# Patient Record
Sex: Male | Born: 1970 | Race: White | Hispanic: No | Marital: Married | Smoking: Never smoker
Health system: Southern US, Community
[De-identification: ages and names within clinical notes are randomized; demographics above are authoritative.]

---

## 2011-04-28 ENCOUNTER — Ambulatory Visit (INDEPENDENT_AMBULATORY_CARE_PROVIDER_SITE_OTHER): Payer: 59

## 2011-04-28 DIAGNOSIS — R5381 Other malaise: Secondary | ICD-10-CM

## 2011-04-28 DIAGNOSIS — J40 Bronchitis, not specified as acute or chronic: Secondary | ICD-10-CM

## 2011-04-28 DIAGNOSIS — R05 Cough: Secondary | ICD-10-CM

## 2011-04-28 DIAGNOSIS — J019 Acute sinusitis, unspecified: Secondary | ICD-10-CM

## 2017-10-12 ENCOUNTER — Encounter (HOSPITAL_COMMUNITY): Payer: Self-pay

## 2017-10-12 ENCOUNTER — Emergency Department (HOSPITAL_COMMUNITY)
Admission: EM | Admit: 2017-10-12 | Discharge: 2017-10-12 | Disposition: A | Discharge status: Home / Self Care | Payer: No Typology Code available for payment source | Attending: Physician Assistant | Admitting: Physician Assistant

## 2017-10-12 ENCOUNTER — Other Ambulatory Visit: Payer: Self-pay

## 2017-10-12 ENCOUNTER — Emergency Department (HOSPITAL_COMMUNITY): Payer: No Typology Code available for payment source

## 2017-10-12 DIAGNOSIS — Y939 Activity, unspecified: Secondary | ICD-10-CM | POA: Diagnosis not present

## 2017-10-12 DIAGNOSIS — S86111A Strain of other muscle(s) and tendon(s) of posterior muscle group at lower leg level, right leg, initial encounter: Secondary | ICD-10-CM | POA: Diagnosis not present

## 2017-10-12 DIAGNOSIS — X58XXXA Exposure to other specified factors, initial encounter: Secondary | ICD-10-CM | POA: Insufficient documentation

## 2017-10-12 DIAGNOSIS — Y990 Civilian activity done for income or pay: Secondary | ICD-10-CM | POA: Diagnosis not present

## 2017-10-12 DIAGNOSIS — Y929 Unspecified place or not applicable: Secondary | ICD-10-CM | POA: Diagnosis not present

## 2017-10-12 DIAGNOSIS — Z79899 Other long term (current) drug therapy: Secondary | ICD-10-CM | POA: Insufficient documentation

## 2017-10-12 MED ORDER — IBUPROFEN 400 MG PO TABS
600.0000 mg | ORAL_TABLET | Freq: Once | ORAL | Status: AC
  Administered 2017-10-12: 600 mg via ORAL
  Filled 2017-10-12: qty 1

## 2017-10-12 MED ORDER — METHOCARBAMOL 500 MG PO TABS
500.0000 mg | ORAL_TABLET | Freq: Once | ORAL | Status: AC
  Administered 2017-10-12: 500 mg via ORAL
  Filled 2017-10-12: qty 1

## 2017-10-12 MED ORDER — METHOCARBAMOL 500 MG PO TABS: 500.0000 mg | ORAL_TABLET | Freq: Two times a day (BID) | ORAL | 0 refills | Status: DC

## 2017-10-12 NOTE — ED Provider Notes (Signed)
Patient placed in Quick Look pathway, seen and evaluated   Chief Complaint: Right leg pain  HPI:   Patient was stepping up on the bumper of his car with his right leg when he felt a pop in his right calf and had acute onset pain.  Since then, he reports significant calf pain, which is constant severe.  He has not taken anything for pain including Tylenol or ibuprofen.  He denies numbness or tingling.  He denies hitting his leg.  He has no medical problems, does not take medications daily.  No recent antibiotics or steroids.  He does not have an orthopedic doctor.  No injury elsewhere.  ROS: Right leg pain  Physical Exam:   Gen: No distress  Neuro: Awake and Alert  Skin: Warm    Focused Exam: Tenderness palpation of the right gastroc muscle.  Mild tenderness to palpation of lower posterior right leg and posterior right upper leg.  Pedal pulses intact bilaterally.  Sensation intact bilaterally.  Achilles tendon intact.  Will give medications for pain, obtain dg R tib fib. Likely muscle strain.    Initiation of care has begun. The patient has been counseled on the process, plan, and necessity for staying for the completion/evaluation, and the remainder of the medical screening examination    Alveria Apley, PA-C 10/12/17 1515    Arby Barrette, MD 10/20/17 1526

## 2017-10-12 NOTE — Progress Notes (Signed)
Orthopedic Tech Progress Note Patient Details:  Shane Shaw [birth year] [id number]  Ortho Devices Type of Ortho Device: Ace wrap, Crutches Ortho Device/Splint Interventions: Ordered, Application, Adjustment   Post Interventions Patient Tolerated: Well Instructions Provided: Care of device   [name] 10/12/2017, 6:39 PM

## 2017-10-12 NOTE — ED Provider Notes (Signed)
[facility] EMERGENCY DEPARTMENT Provider Note   CSN: [id number] Arrival date & time: 10/12/17  1334     History   Chief Complaint Chief Complaint  Patient presents with  . Leg Injury    HPI Everard Interrante is a 47 y.o. male with no significant past medical history who presents to the emergency department from [facility] with a chief complaint of right lower leg injury.  The patient reports that he heard a "pop" followed by sudden onset, severe pain in the mid right calf earlier today while he was at work stepping up into a truck bed.  He has been unable to put weight or ambulate since the injury.  He denies numbness or weakness.  No right knee, ankle, or right foot pain.  He was seen at urgent care, but was advised to come to the emergency department for further evaluation for a possible Achilles tendon rupture.  No history of right lower extremity injury or surgery.  He is a non-smoker.  No history of diabetes.  The history is provided by the patient. No language interpreter was used.    History reviewed. No pertinent past medical history.  There are no active problems to display for this patient.   History reviewed. No pertinent surgical history.      Home Medications    Prior to Admission medications   Medication Sig Start Date End Date Taking? Authorizing Provider  cetirizine (ZYRTEC) 10 MG tablet Take 10 mg by mouth daily as needed for allergies.   Yes [provider]  loratadine (CLARITIN) 10 MG tablet Take 10 mg by mouth daily as needed for allergies.   Yes [provider]  methocarbamol (ROBAXIN) 500 MG tablet Take 1 tablet (500 mg total) by mouth 2 (two) times daily. 10/12/17   Brystal Kildow A, PA-C    Family History History reviewed. No pertinent family history.  Social History Social History   Tobacco Use  . Smoking status: Never Smoker  . Smokeless tobacco: Never Used  Substance Use Topics  . Alcohol use: Not Currently    . Drug use: Not on file     Allergies   Patient has no known allergies.   Review of Systems Review of Systems  Constitutional: Negative for activity change.  Respiratory: Negative for shortness of breath.   Cardiovascular: Negative for chest pain.  Gastrointestinal: Negative for abdominal pain.  Musculoskeletal: Positive for gait problem and myalgias. Negative for arthralgias, back pain and joint swelling.  Skin: Negative for color change, rash and wound.  Neurological: Negative for weakness and numbness.     Physical Exam Updated Vital Signs BP (!) 132/92 (BP Location: Left Arm)   Pulse 65   Temp 97.9 F (36.6 C) (Oral)   Resp 18   SpO2 97%   Physical Exam  Constitutional: He appears well-developed.  HENT:  Head: Normocephalic.  Eyes: Conjunctivae are normal.  Neck: Neck supple.  Cardiovascular: Normal rate and regular rhythm.  No murmur heard. Pulmonary/Chest: Effort normal.  Abdominal: Soft. He exhibits no distension.  Musculoskeletal: Normal range of motion. He exhibits tenderness. He exhibits no edema or deformity.  Tender to palpation over the right mid calf.  Negative Oestreicher's test.  Achilles tendon is palpable and feels intact with range of motion on exam.  Sensation is intact and symmetric throughout the bilateral lower extremities.  DP and PT pulses are 2+ and symmetric.  Full active and passive range of motion of the right knee and ankle.  5 out of 5 strength against resistance with plantarflexion.  Decreased strength against resistance with dorsiflexion secondary to pain.  No overlying ecchymosis, erythema, edema, or warmth to the right calf.  Neurological: He is alert.  Skin: Skin is warm and dry.  Psychiatric: His behavior is normal.  Nursing note and vitals reviewed.    ED Treatments / Results  Labs (all labs ordered are listed, but only abnormal results are displayed) Labs Reviewed - No data to display  EKG None  Radiology Dg Tibia/fibula  Right  Result Date: 10/12/2017 CLINICAL DATA:  Acute pain with possible tendon rupture. EXAM: RIGHT TIBIA AND FIBULA - 2 VIEW COMPARISON:  None. FINDINGS: No bone or joint abnormality. Achilles tendon shadow does not appear disrupted or thickened. IMPRESSION: Negative radiographs. Electronically Signed   By: Paulina Fusi M.D.   On: 10/12/2017 16:25    Procedures Procedures (including critical care time)  Medications Ordered in ED Medications  methocarbamol (ROBAXIN) tablet 500 mg (500 mg Oral Given 10/12/17 1547)  ibuprofen (ADVIL,MOTRIN) tablet 600 mg (600 mg Oral Given 10/12/17 1547)     Initial Impression / Assessment and Plan / ED Course  I have reviewed the triage vital signs and the nursing notes.  Pertinent labs & imaging results that were available during my care of the patient were reviewed by me and considered in my medical decision making (see chart for details).     47 year old male presenting with a right lower extremity injury.  On exam, the patient is tender to palpation in the posterior, mid right calf.  No bony tenderness.  No overlying ecchymosis or swelling concerning for hematoma.  Patient X-Ray negative for obvious fracture or dislocation.  The Achilles tendon shadow does not appear thickened or disrupted.  Pain managed in ED. doubt compartment syndrome, hematoma, or Achilles tendon rupture.  Pt advised to follow up with orthopedics if symptoms persist for possibility of missed fracture diagnosis. Patient given ace wrap and crutches while in ED, conservative therapy recommended and discussed. Patient will be dc home & is agreeable with above plan.  Final Clinical Impressions(s) / ED Diagnoses   Final diagnoses:  Strain of right gastrocnemius muscle, initial encounter    ED Discharge Orders        Ordered    methocarbamol (ROBAXIN) 500 MG tablet  2 times daily     10/12/17 1757       Jossiah Smoak, Coral Else, PA-C 10/12/17 1806    Mackuen, Cindee Salt, MD 10/13/17  0019

## 2017-10-12 NOTE — ED Notes (Signed)
See provider assessment 

## 2017-10-12 NOTE — ED Triage Notes (Signed)
Pt states he was attempting to step up a step at work and felt a pop in his right calf. Pt went to an urgent care and was sent over for further evaluation of possible achilles rupture. Pt states he is unable to completely straighten his leg.

## 2017-10-12 NOTE — Discharge Instructions (Addendum)
Thank you for allowing me to provide your care today in the emergency department.   Apply ice for 20 minutes up to 4 times daily until your swelling improves. Use an ace wrap or a compression sleeve over the calf for pain and swelling control. When you are sitting and resting, elevate your right leg on pillows above the level of your heart.   Take 600 mg of ibuprofen or 650 mg of Tylenol every 6 hours for pain control. You can also alternate between these two medications every 3 hours. Take one tablet of Robaxin up to 2 times daily for muscle pain or spasms.  Start putting weight on your right foot as your pain allows. Once you can walk without a significant limp, start taking walks three times each day for 10 minutes at a time. If you can perform a heel raise standing on both legs without unbearable pain, start performing this exercise regularly, starting with a single set of fiver to eight repetitions with the knees pent, and then repeating this with the knees straight.  Gradually, increase the number of sets and repetitions. Your ultimate goal should be able to perform three sets of 15 repetitions with the knees straight and then the same number with the knees bent.  If your pain does not start to improve with this regimen in the next week, please call and schedule follow-up appointment with Dr. August Saucer.  Return to the emergency department if you develop new numbness or weakness in the right lower leg or foot, if your toes turn blue, if you are unable to feel a pulse in the right foot, if you have a new fall or injury, or develop other new concerning symptoms.

## 2019-01-30 ENCOUNTER — Ambulatory Visit
Admission: RE | Admit: 2019-01-30 | Discharge: 2019-01-30 | Disposition: A | Discharge status: Home / Self Care | Payer: 59 | Source: Ambulatory Visit | Attending: Family Medicine | Admitting: Family Medicine

## 2019-01-30 ENCOUNTER — Other Ambulatory Visit: Payer: Self-pay

## 2019-01-30 ENCOUNTER — Ambulatory Visit (INDEPENDENT_AMBULATORY_CARE_PROVIDER_SITE_OTHER): Payer: 59 | Admitting: Family Medicine

## 2019-01-30 ENCOUNTER — Ambulatory Visit: Payer: Self-pay

## 2019-01-30 VITALS — BP 120/70 | Ht 70.0 in | Wt 215.0 lb

## 2019-01-30 DIAGNOSIS — M25511 Pain in right shoulder: Secondary | ICD-10-CM

## 2019-01-30 MED ORDER — DICLOFENAC SODIUM 75 MG PO TBEC: 75.0000 mg | DELAYED_RELEASE_TABLET | Freq: Two times a day (BID) | ORAL | 1 refills | Status: DC

## 2019-01-30 NOTE — Patient Instructions (Signed)
I'm concerned you have a tear of your supraspinatus (one of your rotator cuff muscles). We will go ahead with an MRI to further assess. Ice the area 15 minutes at a time 3-4 times a day. Diclofenac 75mg  twice a day with food for pain and inflammation - don't take aleve or ibuprofen while taking this. Ok to take tylenol though if you need something in addition to the diclofenac. Follow up with me for a no charge visit to go over the MRI results.

## 2019-01-30 NOTE — Progress Notes (Signed)
PCP: Patient, No Pcp Per  Subjective:   HPI: Patient is a 48 y.o. male here for right shoulder pain x 3 mo. States has PMHx of right shoulder limited ROM above head, has a "catch" in his shoulder and has to rotate it backward to get is arm over head.  Pt states was doing cross country race 3 mo ago and slipped while running and fell on right shoulder, pt has immediate pain but was able to finish race. States took IBU and iced shoulder for tx of pain, states pain gradually worsened over 3 months, was initially mild but now affects his sleep (side sleeper). 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 Pt states now he has hard time lifting arm overhead. Does endorsee arm weakness but due to pain, denies dropping tools or objects but has difficulty lifting heavier ones. Denies swelling, bruising or skin changes over shoulder. Does endorse some intermittent finger tingling at pinky and ring finger on right hand but denies LOF, numbness or loss of sensation.   No past medical history on file.  Current Outpatient Medications on File Prior to Visit  Medication Sig Dispense Refill  . cetirizine (ZYRTEC) 10 MG tablet Take 10 mg by mouth daily as needed for allergies.    Marland Kitchen. loratadine (CLARITIN) 10 MG tablet Take 10 mg by mouth daily as needed for allergies.    . methocarbamol (ROBAXIN) 500 MG tablet Take 1 tablet (500 mg total) by mouth 2 (two) times daily. 20 tablet 0   No current facility-administered medications on file prior to visit.     No past surgical history on file.  No Known Allergies  Social History   Socioeconomic History  . Marital status: Married    Spouse name: Not on file  . Number of children: Not on file  . Years of education: Not on file  . Highest education level: Not on file  Occupational History  . Not on file  Social Needs  . Financial resource strain: Not on file  . Food insecurity    Worry: Not on file    Inability: Not on file   . Transportation needs    Medical: Not on file    Non-medical: Not on file  Tobacco Use  . Smoking status: Never Smoker  . Smokeless tobacco: Never Used  Substance and Sexual Activity  . Alcohol use: Not Currently  . Drug use: Not on file  . Sexual activity: Not on file  Lifestyle  . Physical activity    Days per week: Not on file    Minutes per session: Not on file  . Stress: Not on file  Relationships  . Social Musicianconnections    Talks on phone: Not on file    Gets together: Not on file    Attends religious service: Not on file    Active member of club or organization: Not on file    Attends meetings of clubs or organizations: Not on file    Relationship status: Not on file  . Intimate partner violence    Fear of current or ex partner: Not on file    Emotionally abused: Not on file    Physically abused: Not on file    Forced sexual activity: Not on file  Other Topics Concern  . Not on file  Social History Narrative  . Not on file    No family history on file.  BP 120/70   Ht 5'  10" (1.778 m)   Wt 215 lb (97.5 kg)   BMI 30.85 kg/m   Review of Systems: See HPI above.     Objective:  Physical Exam:  Gen: NAD, comfortable in exam room Pulm: no increased work of breathing CA: cap refill<2sec Neck: atraumatic, cervical processes midline w/o TTP, Full AROM, neg Spurling  Shoulder R: symmetrical with left w/o swelling or ecchymosis. clavicles intact bilaterally.  No instability.  Minimal tenderness of AC joint. Limited ABDuction left shoulder to 90 degrees, limited shoulder extension to 90 degrees, pt unable to reach behind back 2/2 pain. 4/5 shoulder ABduction, ext rotation and extension when compared to R. Pos empty can and near test left side. Pn and limited shoulder external rotation to 45 degrees.   NVI distally.  Elbow R: no swelling, AFOM, no TTP over ulnar n. No subluxation palpated 5/5 ext/flexion and suponation and pronation, sensation intact   Wrist R. No  swelling, Full AROM, neg tinel's sign over wrist, 5/5 wrist ext/flex with hand interosseous muscles intact, sensation intact Ulnar and brachioradialis reflex intact L & right 1+   Shoulder L: No swelling, ecchymoses.  No gross deformity. No TTP. FROM. Strength 5/5 with empty can and resisted internal/external rotation. NV intact distally.  Complete R shoulder exam [place]:  Biceps tendon: hypoechoic region noted about 20% of area of tendon but without tear.  No tenosynovitis. Subscapularis: intact without abnormalities AC joint: small effusion, mild arthropathy.  No other abnormalities - no indication grade 3+ separation Infraspinatus: intact without abnormalities Supraspinatus: Unable to position in modified crass position.  Visualized supraspinatus with hypoechoic signal on bursal and insertional sides with some fibers visualized out to insertion.  Glenohumeral joint: no obvious abnormalities.  Visualized labrum appears normal.   Impression: Proximal biceps tendinopathy.  Concern for high grade partial supraspinatus tear.  Assessment & Plan:  1. Right shoulder injury - concern for high grade partial supraspinatus tear given weakness on testing, decreased motion, and ultrasound findings.  Radiographs negative - will go ahead with MRI to further assess, consider ortho referral for repair.  Icing, diclofenac, tylenol in meantime.  F/u to go over MRI.

## 2019-01-31 ENCOUNTER — Encounter: Payer: Self-pay | Admitting: Family Medicine

## 2019-03-21 ENCOUNTER — Other Ambulatory Visit: Payer: Self-pay | Admitting: Family Medicine

## 2019-03-26 ENCOUNTER — Telehealth: Payer: Self-pay

## 2019-03-26 NOTE — Telephone Encounter (Signed)
Pt states he doesn't need a refill, the pharmacy just automatically generated the request. His shoulder has been feeling a little better so he is going to continue doing what he's doing and if it gets worse he might proceed with getting the MRI. He will let us know.

## 2019-10-09 ENCOUNTER — Encounter: Payer: Self-pay | Admitting: Family Medicine

## 2019-10-09 ENCOUNTER — Other Ambulatory Visit: Payer: Self-pay

## 2019-10-09 ENCOUNTER — Ambulatory Visit (INDEPENDENT_AMBULATORY_CARE_PROVIDER_SITE_OTHER): Payer: 59 | Admitting: Family Medicine

## 2019-10-09 VITALS — BP 132/90 | Ht 70.0 in | Wt 220.0 lb

## 2019-10-09 DIAGNOSIS — M722 Plantar fascial fibromatosis: Secondary | ICD-10-CM

## 2019-10-09 MED ORDER — DICLOFENAC SODIUM 75 MG PO TBEC: 75.0000 mg | DELAYED_RELEASE_TABLET | Freq: Two times a day (BID) | ORAL | 2 refills | Status: DC

## 2019-10-09 NOTE — Patient Instructions (Signed)
You have plantar fasciitis Take diclofenac 75mg  twice a day with food for a week then as needed. Plantar fascia stretch for 20-30 seconds (do 3 of these) in morning Lowering/raise on a step exercises 3 x 10 once or twice a day - this is very important for long term recovery. Can add heel walks, toe walks forward and backward as well Ice heel for 15 minutes as needed. Avoid flat shoes/barefoot walking as much as possible. Arch straps have been shown to help with pain. Inserts are important (superfeet, spencos, our green insoles). Steroid injection is a consideration for short term pain relief if you are struggling. Physical therapy is also an option. Follow up with me in 6 weeks.

## 2019-10-09 NOTE — Progress Notes (Signed)
PCP: Patient, No Pcp Per  Subjective:   HPI: Patient is a 49 y.o. male here for left heel pain.  Patient reports for about 1 month he's had worsening left plantar heel pain. No acute injury or trauma. 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 Pain worse in the morning and by the end of the work day. Feels better with shoes that have a lift to them. Has been icing, rolling on a frozen water bottle with transient relief.  History reviewed. No pertinent past medical history.  Current Outpatient Medications on File Prior to Visit  Medication Sig Dispense Refill  . cetirizine (ZYRTEC) 10 MG tablet Take 10 mg by mouth daily as needed for allergies.    Marland Kitchen loratadine (CLARITIN) 10 MG tablet Take 10 mg by mouth daily as needed for allergies.    . methocarbamol (ROBAXIN) 500 MG tablet Take 1 tablet (500 mg total) by mouth 2 (two) times daily. 20 tablet 0   No current facility-administered medications on file prior to visit.    History reviewed. No pertinent surgical history.  No Known Allergies  Social History   Socioeconomic History  . Marital status: Married    Spouse name: Not on file  . Number of children: Not on file  . Years of education: Not on file  . Highest education level: Not on file  Occupational History  . Not on file  Tobacco Use  . Smoking status: Never Smoker  . Smokeless tobacco: Never Used  Substance and Sexual Activity  . Alcohol use: Not Currently  . Drug use: Not on file  . Sexual activity: Not on file  Other Topics Concern  . Not on file  Social History Narrative  . Not on file   Social Determinants of Health   Financial Resource Strain:   . Difficulty of Paying Living Expenses:   Food Insecurity:   . Worried About Charity fundraiser in the Last Year:   . Arboriculturist in the Last Year:   Transportation Needs:   . Film/video editor (Medical):   Marland Kitchen Lack of Transportation (Non-Medical):   Physical Activity:    . Days of Exercise per Week:   . Minutes of Exercise per Session:   Stress:   . Feeling of Stress :   Social Connections:   . Frequency of Communication with Friends and Family:   . Frequency of Social Gatherings with Friends and Family:   . Attends Religious Services:   . Active Member of Clubs or Organizations:   . Attends Archivist Meetings:   Marland Kitchen Marital Status:   Intimate Partner Violence:   . Fear of Current or Ex-Partner:   . Emotionally Abused:   Marland Kitchen Physically Abused:   . Sexually Abused:     History reviewed. No pertinent family history.  BP 132/90   Ht 5\' 10"  (1.778 m)   Wt 220 lb (99.8 kg)   BMI 31.57 kg/m   Review of Systems: See HPI above.     Objective:  Physical Exam:  Gen: NAD, comfortable in exam room  Left foot/ankle: No gross deformity, swelling, ecchymoses FROM ankle with 5/5 strength all directions without pain. TTP proximal plantar fascia and insertion at medial calcaneus.  No other tenderness. Negative ant drawer and talar tilt.   Negative syndesmotic compression. Negative calcaneal squeeze. Thompsons test negative. NV intact distally.   Assessment & Plan:  1. Left plantar fasciitis - home exercises  and stretches reviewed.  Arch supports encouraged, arch binders.  Voltaren twice a day for a week then as needed.  Icing.  Consider injection, physical therapy if not improving.  F/u in 6 weeks.

## 2019-12-30 ENCOUNTER — Other Ambulatory Visit: Payer: Self-pay | Admitting: Family Medicine

## 2020-03-20 ENCOUNTER — Other Ambulatory Visit: Payer: Self-pay | Admitting: Family Medicine

## 2020-04-16 ENCOUNTER — Other Ambulatory Visit: Payer: Self-pay | Admitting: *Deleted

## 2020-04-16 MED ORDER — DICLOFENAC SODIUM 75 MG PO TBEC: 75.0000 mg | DELAYED_RELEASE_TABLET | Freq: Two times a day (BID) | ORAL | 2 refills | Status: DC

## 2020-07-01 ENCOUNTER — Other Ambulatory Visit: Payer: Self-pay | Admitting: Family Medicine

## 2020-09-28 IMAGING — DX DG SHOULDER 2+V*R*
3 series · 3 of 3 positions shown · non-contrast
Comparison: None.

CLINICAL DATA: 48-year-old male with right shoulder pain.

EXAM:
RIGHT SHOULDER - 2+ VIEW

[dg shoulder right (1 of 3)]
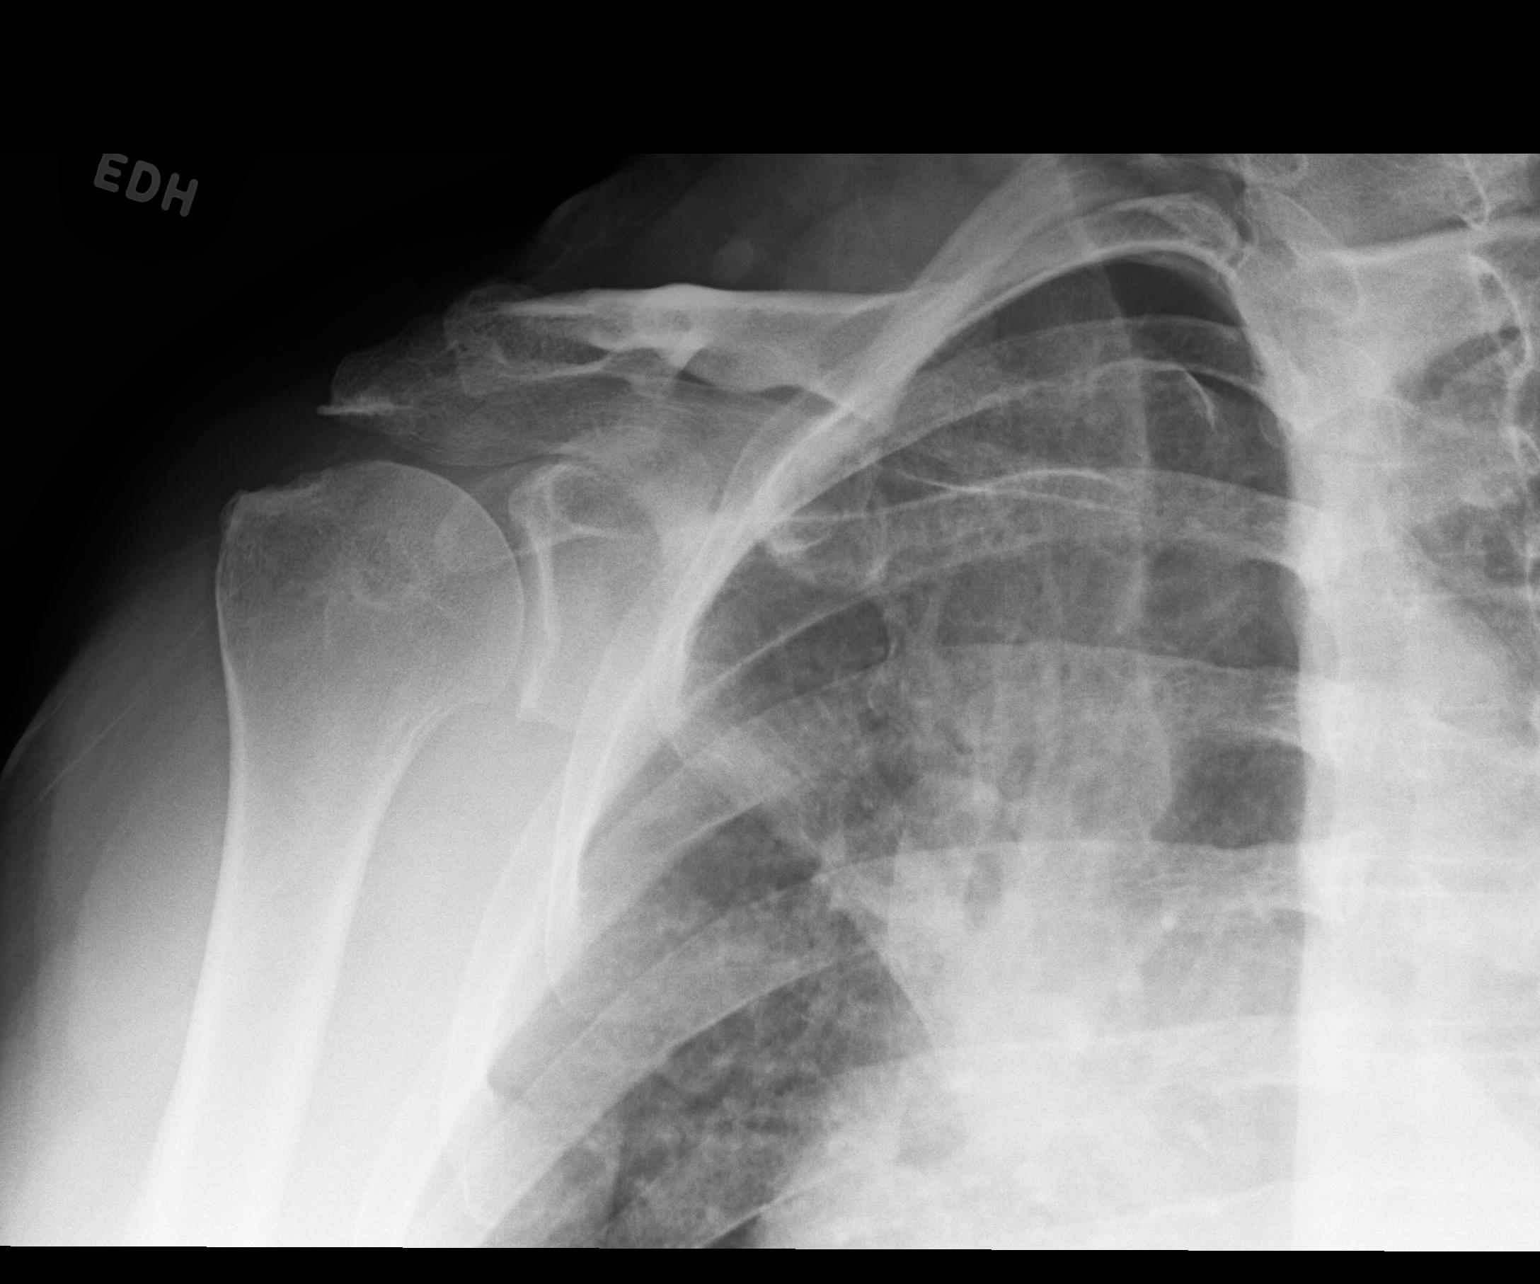

[dg shoulder right (2 of 3)]
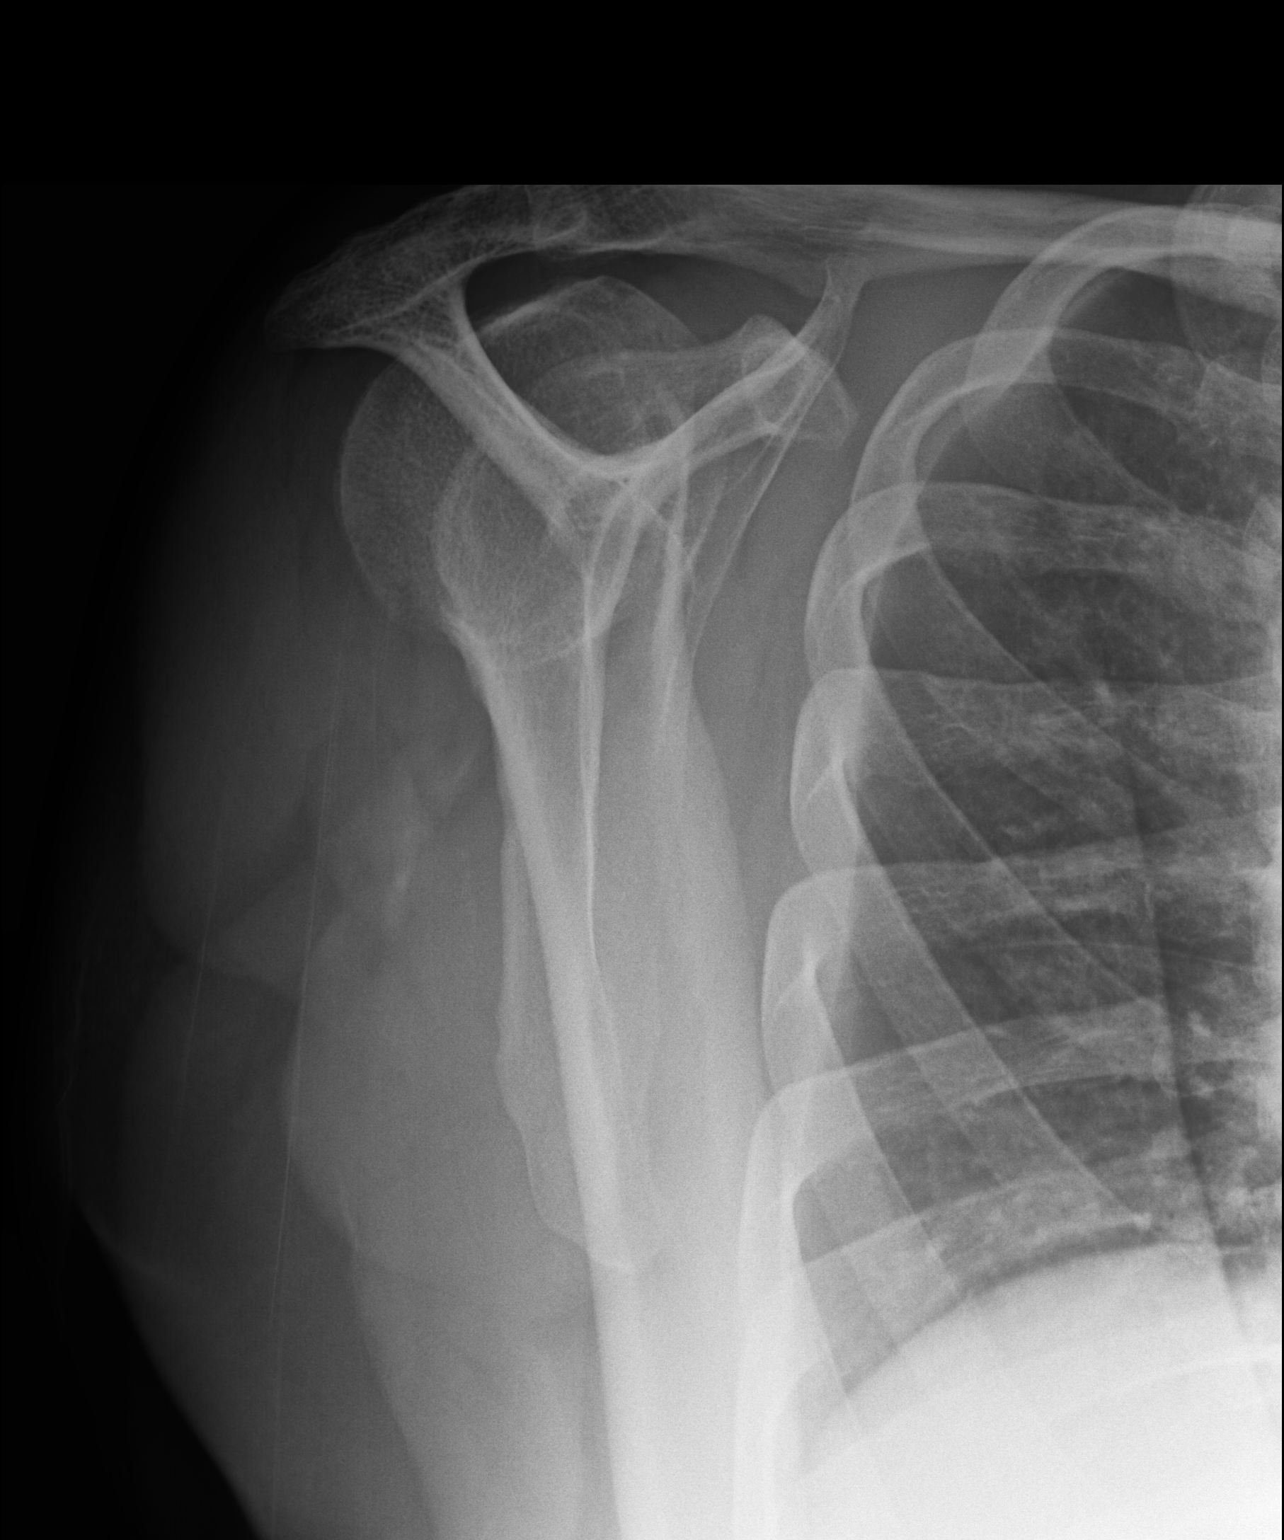

[dg shoulder right (3 of 3)]
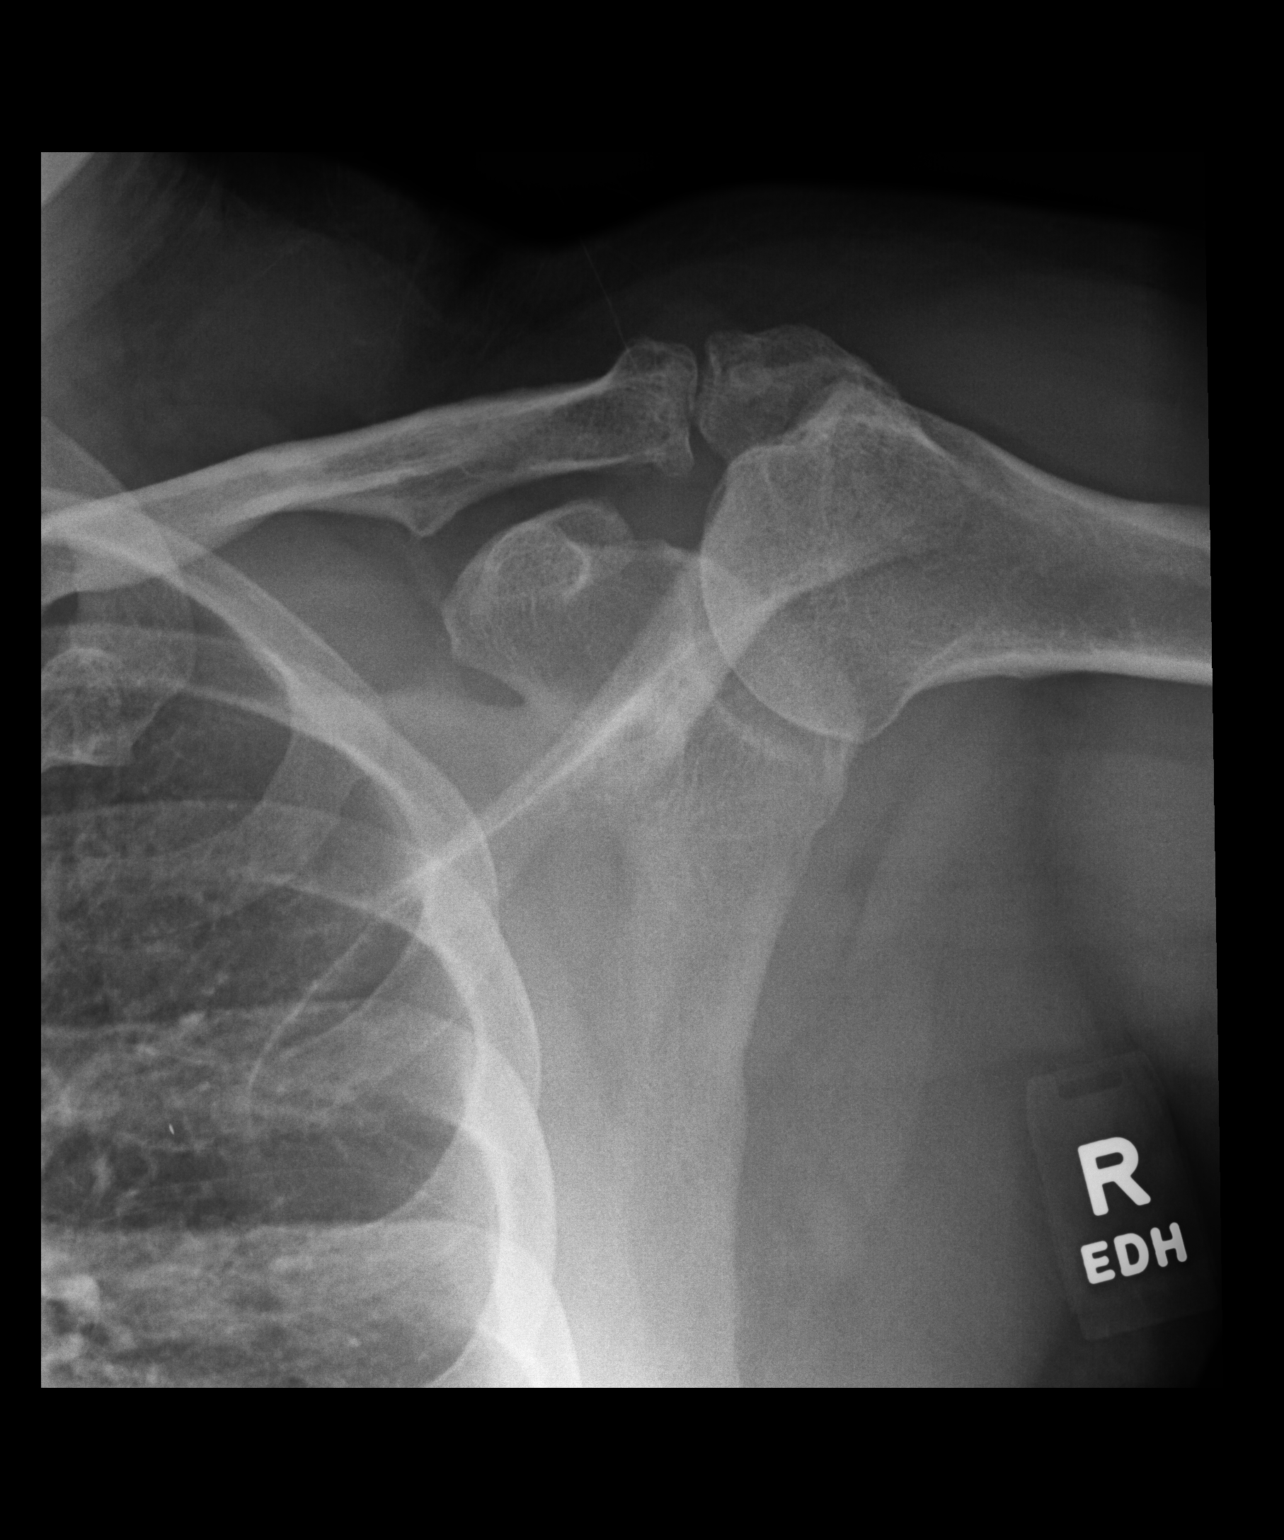

[3 of 3 positions shown; findings below may reference images not displayed]

FINDINGS: There is no acute fracture or dislocation. No significant arthritic
changes of the shoulder. There is mild arthritic changes of the
right AC joint with bone spurring. The soft tissues are
unremarkable.
IMPRESSION: 1. No acute fracture or dislocation.
2. Mild degenerative changes of the right AC joint.

## 2020-10-28 ENCOUNTER — Ambulatory Visit
Admission: RE | Admit: 2020-10-28 | Discharge: 2020-10-28 | Disposition: A | Discharge status: Home / Self Care | Payer: 59 | Source: Ambulatory Visit | Attending: Family Medicine | Admitting: Family Medicine

## 2020-10-28 ENCOUNTER — Encounter: Payer: Self-pay | Admitting: Family Medicine

## 2020-10-28 ENCOUNTER — Ambulatory Visit: Payer: Self-pay

## 2020-10-28 ENCOUNTER — Other Ambulatory Visit: Payer: Self-pay

## 2020-10-28 ENCOUNTER — Ambulatory Visit (INDEPENDENT_AMBULATORY_CARE_PROVIDER_SITE_OTHER): Payer: 59 | Admitting: Family Medicine

## 2020-10-28 DIAGNOSIS — S46012A Strain of muscle(s) and tendon(s) of the rotator cuff of left shoulder, initial encounter: Secondary | ICD-10-CM

## 2020-10-28 MED ORDER — DICLOFENAC SODIUM 75 MG PO TBEC: 75.0000 mg | DELAYED_RELEASE_TABLET | Freq: Two times a day (BID) | ORAL | 1 refills | Status: DC

## 2020-10-28 MED ORDER — HYDROCODONE-ACETAMINOPHEN 5-325 MG PO TABS: 1.0000 | ORAL_TABLET | Freq: Four times a day (QID) | ORAL | 0 refills | Status: DC | PRN

## 2020-10-28 NOTE — Patient Instructions (Signed)
I'm concerned you have a rotator cuff tear of your shoulder. We will go ahead with an MRI to further assess - I'll call you with the results and next steps. Diclofenac 75mg  twice a day with food for pain and inflammation. Norco as needed for severe pain (no driving on this medicine). Work restrictions as listed.

## 2020-10-28 NOTE — Assessment & Plan Note (Addendum)
Traumatic  supra-supinatus tear. Bedside left shoulder US showed partial width, full thickness tear of left supra spinatus tendon. Ordered left MRI shoulder. Recommended light duties at work. Analgesia: ibuprofen, Voltaren gel and prescribed 20 tablets of norco. Work note provided. Follow up with Dr Pearletha Forge following MRI results.

## 2020-10-28 NOTE — Progress Notes (Addendum)
    SUBJECTIVE:   CHIEF COMPLAINT / HPI:   Shane Shaw is a 50 yr old male who presents with left shoulder pain  Left shoulder pain  Pt reports acute left sided shoulder pain. He was in a closed confined space and he moved backwards while his left shoulder was flexed, adducted and elbow flexed and he suddenly heard a "rip" in his shoulder. He has been on light duties at work since then due to the pain. He has been taking ibuprofen and voltaren gel to the affected area without improvement in sx. Pain is worse at night. He can only move his left shoulder to ~90 degrees of flexion/abduction.  PERTINENT  PMH / PSH: plantar fascitis   OBJECTIVE:   BP (!) 126/92   Ht 5' 9.5" (1.765 m)   Wt 230 lb (104.3 kg)   BMI 33.48 kg/m    Shoulder: Inspection reveals no obvious deformity, atrophy, or asymmetry. No bruising. No swelling TTP of left lateral shoulder joint inferior to the acromion  Limited active ROM in flexion, abduction, internal/external rotation. Full passive ROM in flexion, abduction, internal/external rotation. NV intact distally Normal scapular function observed. Special Tests:  - Supraspinatous: Positive empty can.  Reduced strength with resisted flexion at 20 degrees Mild pain on resisted internal and external rotation   Complete MSK u/s left shoulder: Biceps tendon: intact on long and short views.  Minimal tenosynovitis Pec major tendon: intact Subscapularis: intact without abnormalities AC joint: Mod effusion Infraspinatus: intact Supraspinatus: apparent full thickness partial width tear near biceps tendon. Posterior glenohumeral joint: no effusion.  Posterior labrum appears normal  Impression:  Full thickness partial width tear of supraspinatus.  ASSESSMENT/PLAN:   Traumatic rotator cuff tear, left, initial encounter Traumatic  supra-supinatus tear. Bedside left shoulder US showed partial width, full thickness tear of left supra spinatus tendon. Ordered left  MRI shoulder. Recommended light duties at work. Analgesia: oral diclofenac and prescribed 20 tablets of norco. Work note provided. Follow up with Dr Pearletha Forge following MRI results.   Shane Octave, MD [facility]   Patient seen and examined with resident.  Ultrasound performed by me and interpreted.  Plan as above.  Addendum:  Spoke with patient on 6/17.  He asked about possibility of doing home exercises and reassessing in 2 weeks.  Advised I would recommend MRI and do not expect much change in 2 weeks but do not think waiting an additional 2 weeks to see how much he is able to improve would be a detriment to if he requires rotator cuff repair.  He is going to think about this and let us know how he would like to proceed.

## 2020-11-11 ENCOUNTER — Encounter: Payer: Self-pay | Admitting: Family Medicine

## 2020-11-11 ENCOUNTER — Ambulatory Visit: Payer: Self-pay

## 2020-11-11 ENCOUNTER — Ambulatory Visit (INDEPENDENT_AMBULATORY_CARE_PROVIDER_SITE_OTHER): Payer: 59 | Admitting: Family Medicine

## 2020-11-11 ENCOUNTER — Other Ambulatory Visit: Payer: Self-pay

## 2020-11-11 VITALS — Ht 70.0 in | Wt 230.0 lb

## 2020-11-11 DIAGNOSIS — S46012A Strain of muscle(s) and tendon(s) of the rotator cuff of left shoulder, initial encounter: Secondary | ICD-10-CM

## 2020-11-11 NOTE — Progress Notes (Signed)
PCP: Patient, No Pcp Per (Inactive)  Subjective:   HPI: Patient is a 50 y.o. male here for left shoulder pain.  6/15: Pt reports acute left sided shoulder pain. He was in a closed confined space and he moved backwards while his left shoulder was flexed, adducted and elbow flexed and he suddenly heard a "rip" in his shoulder. He has been on light duties at work since then due to the pain. He has been taking ibuprofen and voltaren gel to the affected area without improvement in sx. Pain is worse at night. He can only move his left shoulder to ~90 degrees of flexion/abduction.  6/29: Patient reports he feels about 80% better compared to last visit. Motion improved, strength improved also. Doing motion exercises. Sleeping better.  History reviewed. No pertinent past medical history.  Current Outpatient Medications on File Prior to Visit  Medication Sig Dispense Refill   cetirizine (ZYRTEC) 10 MG tablet Take 10 mg by mouth daily as needed for allergies.     diclofenac (VOLTAREN) 75 MG EC tablet Take 1 tablet (75 mg total) by mouth 2 (two) times daily. 60 tablet 1   HYDROcodone-acetaminophen (NORCO) 5-325 MG tablet Take 1 tablet by mouth every 6 (six) hours as needed for moderate pain. 20 tablet 0   loratadine (CLARITIN) 10 MG tablet Take 10 mg by mouth daily as needed for allergies.     No current facility-administered medications on file prior to visit.    History reviewed. No pertinent surgical history.  Allergies  Allergen Reactions   Bee Venom Swelling    Social History   Socioeconomic History   Marital status: Married    Spouse name: Not on file   Number of children: Not on file   Years of education: Not on file   Highest education level: Not on file  Occupational History   Not on file  Tobacco Use   Smoking status: Never   Smokeless tobacco: Never  Substance and Sexual Activity   Alcohol use: Not Currently   Drug use: Not on file   Sexual activity: Not on file   Other Topics Concern   Not on file  Social History Narrative   Not on file   Social Determinants of Health   Financial Resource Strain: Not on file  Food Insecurity: Not on file  Transportation Needs: Not on file  Physical Activity: Not on file  Stress: Not on file  Social Connections: Not on file  Intimate Partner Violence: Not on file    History reviewed. No pertinent family history.  Ht 5\' 10"  (1.778 m)   Wt 230 lb (104.3 kg)   BMI 33.00 kg/m   No flowsheet data found.  No flowsheet data found.  Review of Systems: See HPI above.     Objective:  Physical Exam:  Gen: NAD, comfortable in exam room  Left shoulder: No swelling, ecchymoses.  No gross deformity. No TTP. FROM. Negative Hawkins, Neers. Negative Yergasons. Strength 5/5 with empty can and resisted internal/external rotation.  Minimal pain empty can. NV intact distally.  Limited MSK u/s left shoulder: Infraspinatus and subscapularis intact.  Full thickness tear visualized again within supraspinatus but this is partial width.  Remainder of supraspinatus appears intact.  Subacromial bursitis.   Assessment & Plan:  1. Left shoulder pain - 2/2 full thickness partial width supraspinatus tear.  He has improved quite a bit in the past 2 weeks and intact portion of supraspinatus is compensating well for his tear.  We again  discussed that if this is to be repaired it needs to be done soon after the injury and he verbalized understanding.  He is compensating well with full motion and excellent strength.  Will continue out of work through next week and add strengthening exercises with theraband.  Let us know if he has any issues as he returns to work otherwise f/u prn.

## 2020-11-18 ENCOUNTER — Ambulatory Visit: Payer: 59 | Admitting: Family Medicine

## 2020-12-24 ENCOUNTER — Other Ambulatory Visit: Payer: Self-pay | Admitting: Family Medicine

## 2021-02-15 ENCOUNTER — Other Ambulatory Visit: Payer: Self-pay | Admitting: Family Medicine

## 2022-06-27 IMAGING — DX DG SHOULDER 2+V*L*
3 series · 3 of 3 positions shown · non-contrast
Comparison: None.

CLINICAL DATA: 50-year-old male with left shoulder pain following
injury 2 weeks ago while extending left arm out.

EXAM:
LEFT SHOULDER - 2+ VIEW

[dg shoulder left (1 of 3)]
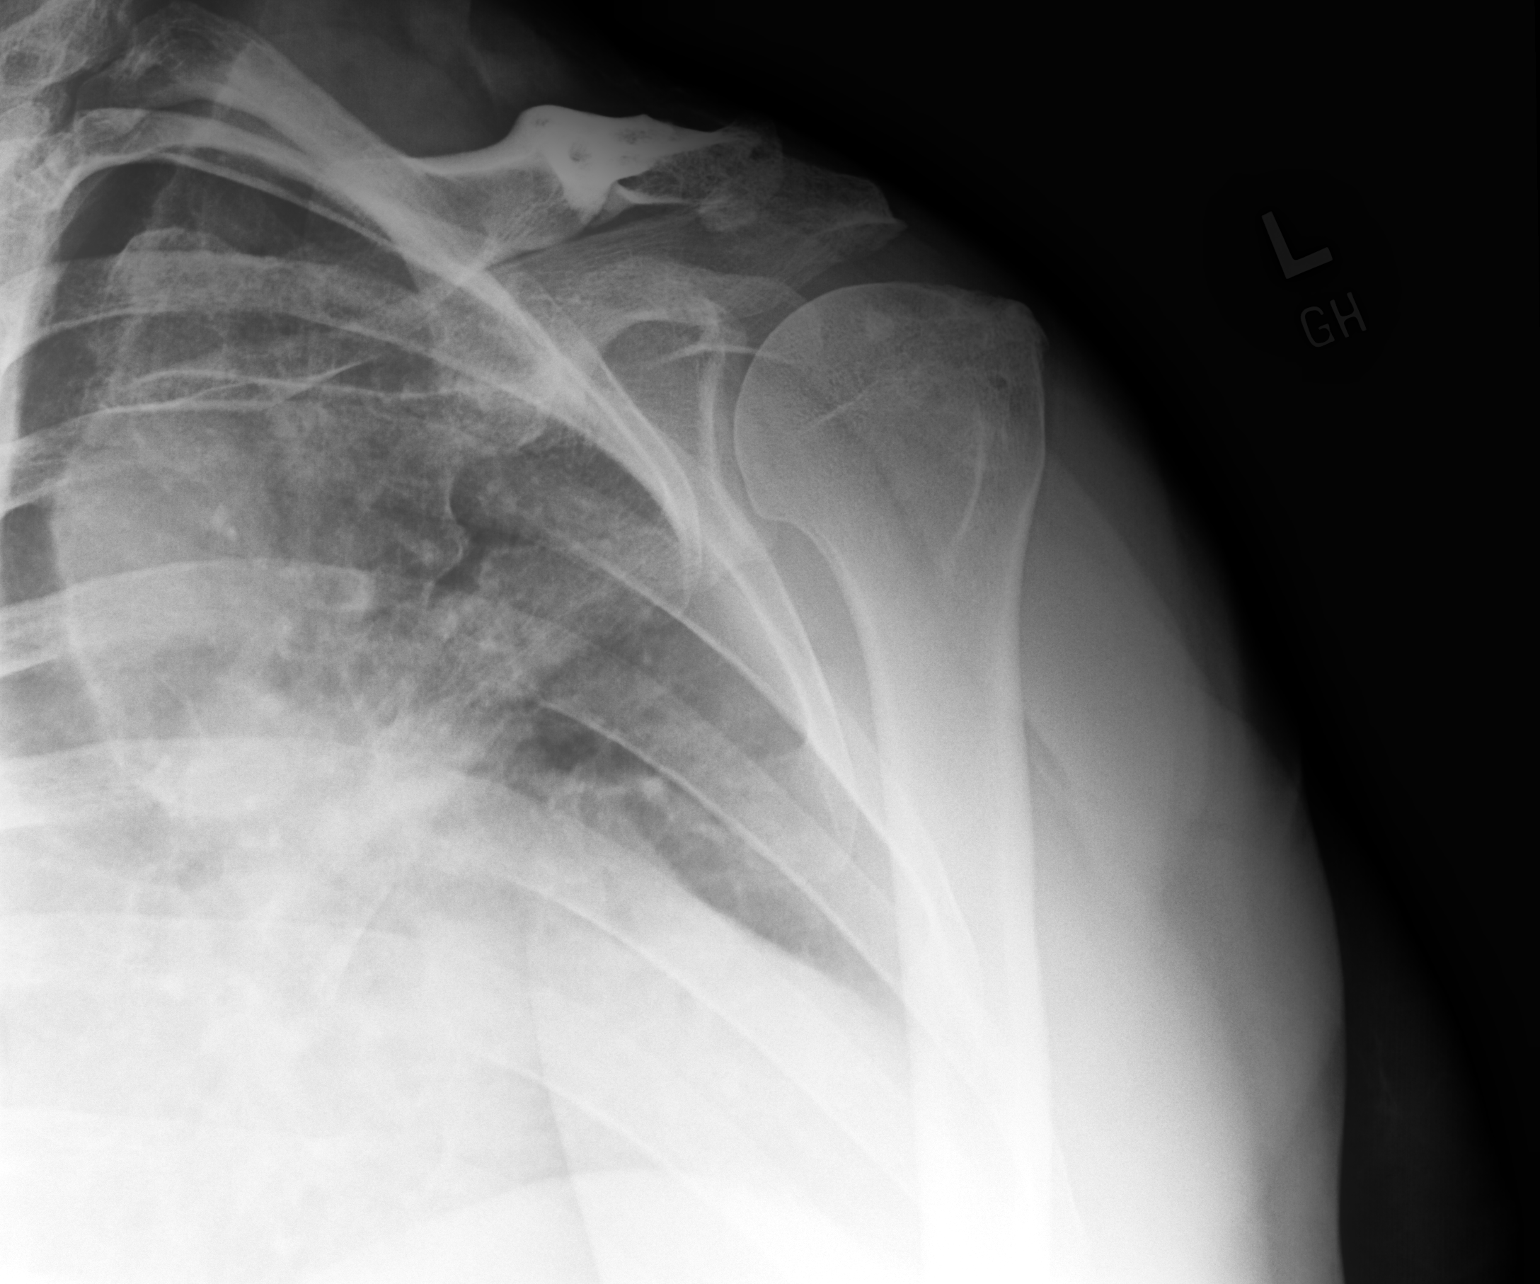

[dg shoulder left (2 of 3)]
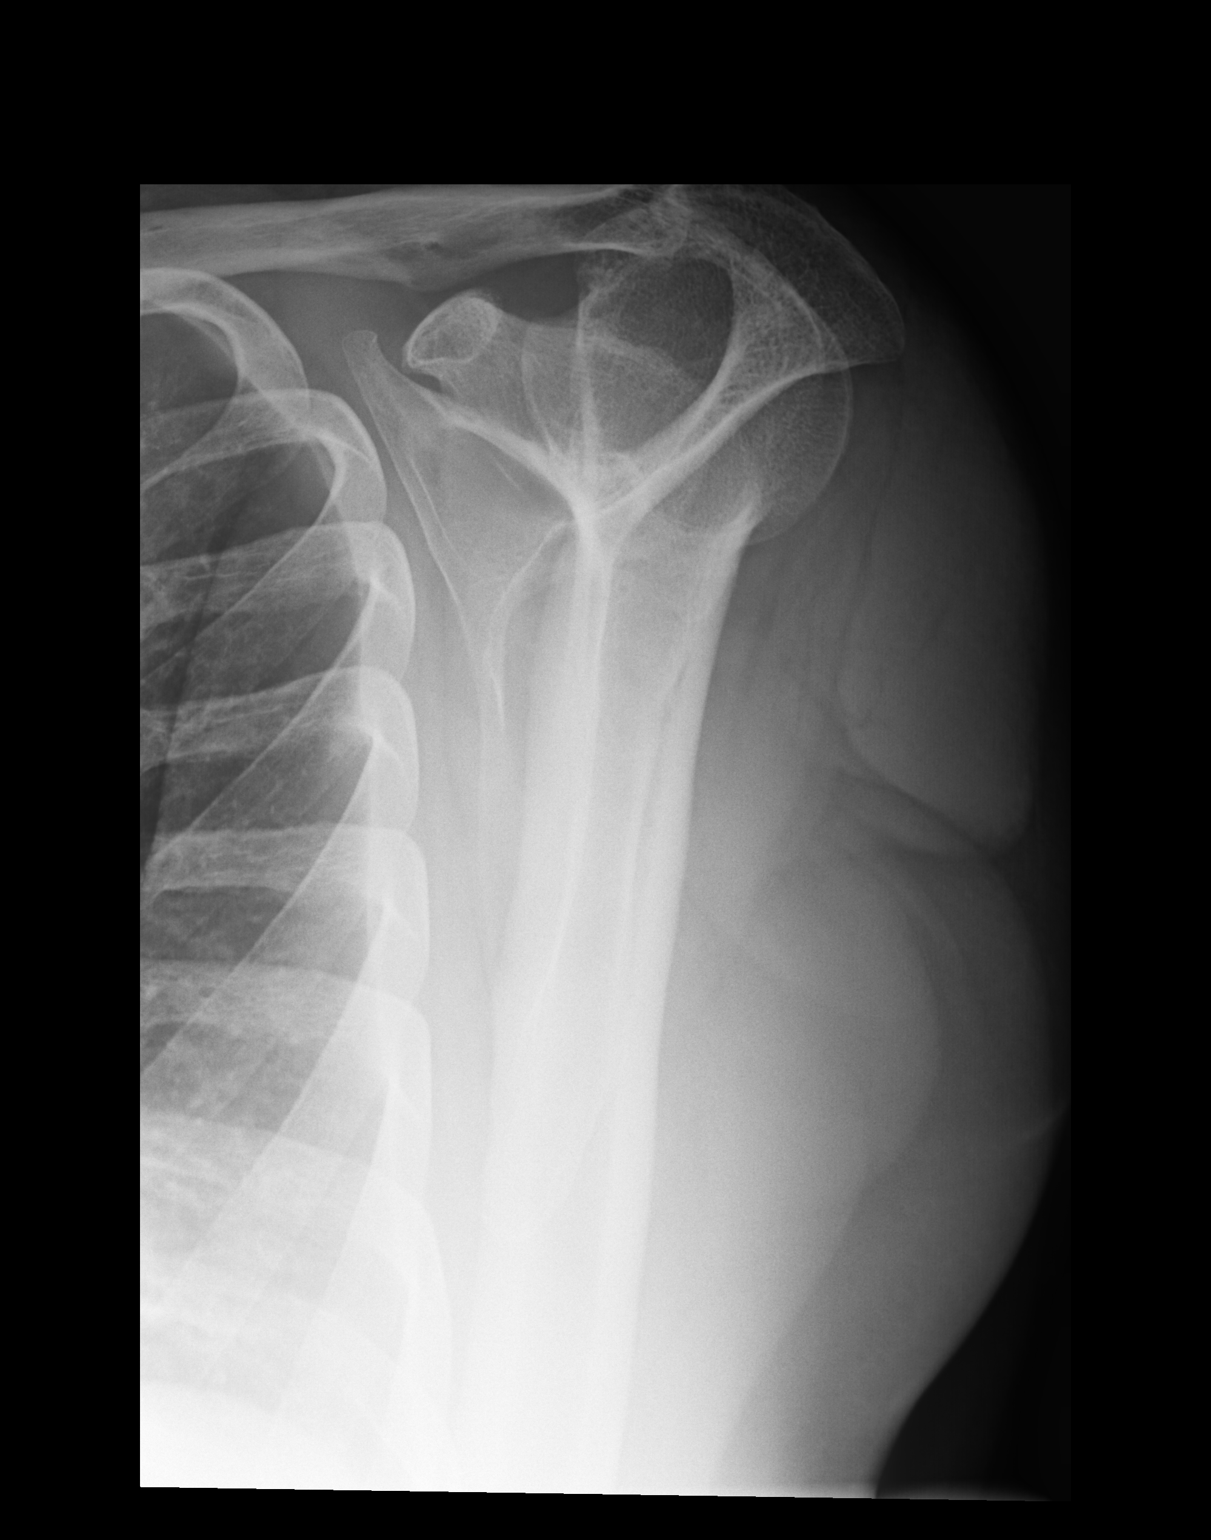

[dg shoulder left (3 of 3)]
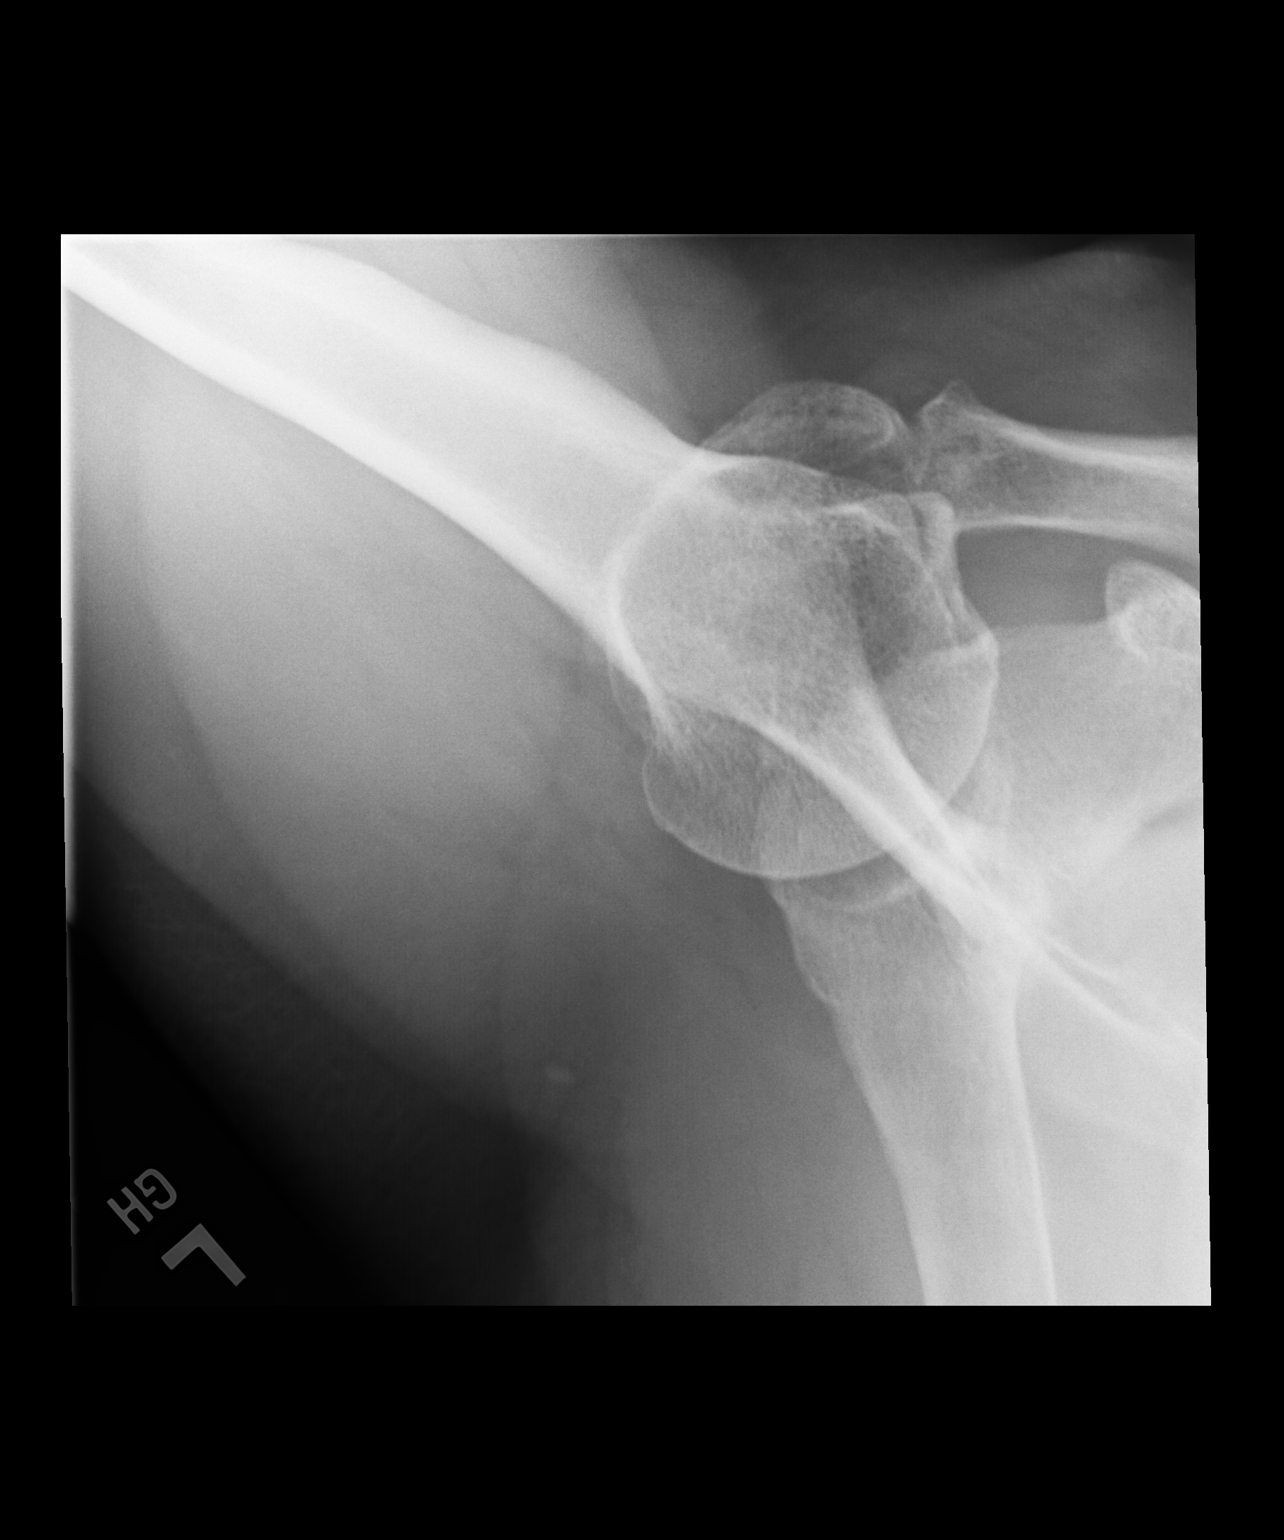

[3 of 3 positions shown; findings below may reference images not displayed]

FINDINGS: No glenohumeral joint dislocation. Proximal left humerus is intact.
Bone mineralization is within normal limits. Left clavicle and
scapula appear intact. Mild degenerative spurring at the left AC
joint. Negative visible left ribs and lung.
IMPRESSION: No acute osseous abnormality identified about the left shoulder.

## 2022-09-09 ENCOUNTER — Encounter: Payer: Self-pay | Admitting: *Deleted

## 2023-12-15 ENCOUNTER — Ambulatory Visit
Admission: RE | Admit: 2023-12-15 | Discharge: 2023-12-15 | Disposition: A | Discharge status: Home / Self Care | Source: Ambulatory Visit | Attending: Sports Medicine | Admitting: Sports Medicine

## 2023-12-15 ENCOUNTER — Other Ambulatory Visit: Payer: Self-pay | Admitting: Sports Medicine

## 2023-12-15 DIAGNOSIS — M5451 Vertebrogenic low back pain: Secondary | ICD-10-CM

## 2024-01-02 DIAGNOSIS — S322XXA Fracture of coccyx, initial encounter for closed fracture: Secondary | ICD-10-CM | POA: Insufficient documentation

## 2024-03-14 DIAGNOSIS — M533 Sacrococcygeal disorders, not elsewhere classified: Secondary | ICD-10-CM | POA: Insufficient documentation

## 2024-03-28 ENCOUNTER — Ambulatory Visit
Admission: RE | Admit: 2024-03-28 | Discharge: 2024-03-28 | Disposition: A | Discharge status: Home / Self Care | Source: Ambulatory Visit | Attending: Family Medicine | Admitting: Family Medicine

## 2024-03-28 VITALS — BP 135/91 | HR 75 | Temp 98.0°F | Resp 18

## 2024-03-28 DIAGNOSIS — J069 Acute upper respiratory infection, unspecified: Secondary | ICD-10-CM

## 2024-03-28 LAB — POC SOFIA SARS ANTIGEN FIA: SARS Coronavirus 2 Ag: NEGATIVE

## 2024-03-28 MED ORDER — FLUTICASONE PROPIONATE 50 MCG/ACT NA SUSP: 2.0000 | Freq: Every day | NASAL | 0 refills | Status: AC

## 2024-03-28 NOTE — ED Triage Notes (Addendum)
 Pt reports nasal congestion and sinus pain & pressure x 4 days. Denies chills, fevers, cough, sore throat, and body aches. Has been using dayquil with some temporary relief. Reports his daughter had a sinus infection a week or two ago. Needs work note for today as he left early.

## 2024-03-28 NOTE — Discharge Instructions (Signed)
 The COVID test is negative  Fluticasone/Flonase nose spray--put 2 sprays in each nostril once daily  You can use Afrin/Neo-Synephrine/oxymetazoline--twice daily as instructed on the box, but not longer than 3 days in a row.

## 2024-03-28 NOTE — ED Provider Notes (Signed)
 [facility]    CSN: [id number] Arrival date & time: 03/28/24  1237      History   Chief Complaint Chief Complaint  Patient presents with   Nasal Congestion    Entered by patient   Facial Pain    HPI Shane Shaw is a 52 y.o. male.   HPI Here for nasal congestion and sinus pressure.  Symptoms have been going on since November 10.  No fever or chills.  Not much cough and no sore throat.  No myalgia.  No shortness of breath and no nausea or vomiting or diarrhea.  DayQuil has been providing some short-term relief.  He was exposed to his daughter who had a sinus infection a week or 2 ago.  NKDA   History reviewed. No pertinent past medical history.  Patient Active Problem List   Diagnosis Date Noted   Pain in the coccyx 03/14/2024   Closed fracture of coccyx (HCC) 01/02/2024   Traumatic rotator cuff tear, left, initial encounter 10/28/2020    History reviewed. No pertinent surgical history.     Home Medications    Prior to Admission medications   Medication Sig Start Date End Date Taking? Authorizing Provider  fluticasone (FLONASE) 50 MCG/ACT nasal spray Place 2 sprays into both nostrils daily. 03/28/24  Yes Talecia Sherlin K, MD  brompheniramine-pseudoephedrine-DM 30-2-10 MG/5ML syrup TAKE 10 ML BY MOUTH EVERY 4 (FOUR) HOURS FOR 7 DAYS. Patient not taking: Reported on 03/28/2024    [provider]  cetirizine (ZYRTEC) 10 MG tablet Take 10 mg by mouth daily as needed for allergies. Patient not taking: Reported on 03/28/2024    [provider]  EPINEPHrine 0.3 mg/0.3 mL IJ SOAJ injection Take 1 auto as needed by injection route as needed for 1 day. Patient not taking: Reported on 03/28/2024 01/02/24   [provider]    Family History History reviewed. No pertinent family history.  Social History Social History   Tobacco Use   Smoking status: Never   Smokeless tobacco: Never  Vaping Use   Vaping status: Never  Used  Substance Use Topics   Alcohol use: Not Currently   Drug use: Never     Allergies   Bee venom and Honey bee venom   Review of Systems Review of Systems   Physical Exam Triage Vital Signs ED Triage Vitals [03/28/24 1319]  Encounter Vitals Group     BP (!) 135/91     Girls Systolic BP Percentile      Girls Diastolic BP Percentile      Boys Systolic BP Percentile      Boys Diastolic BP Percentile      Pulse Rate 75     Resp 18     Temp 98 F (36.7 C)     Temp Source Oral     SpO2 93 %     Weight      Height      Head Circumference      Peak Flow      Pain Score 2     Pain Loc      Pain Education      Exclude from Growth Chart    No data found.  Updated Vital Signs BP (!) 135/91 (BP Location: Left Arm)   Pulse 75   Temp 98 F (36.7 C) (Oral)   Resp 18   SpO2 93%   Visual Acuity Right Eye Distance:   Left Eye Distance:   Bilateral Distance:  Right Eye Near:   Left Eye Near:    Bilateral Near:     Physical Exam Vitals reviewed.  Constitutional:      General: He is not in acute distress.    Appearance: He is not ill-appearing, toxic-appearing or diaphoretic.  HENT:     Right Ear: Tympanic membrane and ear canal normal.     Left Ear: Tympanic membrane and ear canal normal.     Nose: Congestion present.     Mouth/Throat:     Mouth: Mucous membranes are moist.     Comments: Clear mucus draining in the oropharynx Eyes:     Extraocular Movements: Extraocular movements intact.     Conjunctiva/sclera: Conjunctivae normal.     Pupils: Pupils are equal, round, and reactive to light.  Cardiovascular:     Rate and Rhythm: Normal rate and regular rhythm.     Heart sounds: No murmur heard. Pulmonary:     Effort: Pulmonary effort is normal. No respiratory distress.     Breath sounds: Normal breath sounds. No stridor. No wheezing, rhonchi or rales.  Musculoskeletal:     Cervical back: Neck supple.  Lymphadenopathy:     Cervical: No cervical  adenopathy.  Skin:    Capillary Refill: Capillary refill takes less than 2 seconds.     Coloration: Skin is not jaundiced or pale.  Neurological:     General: No focal deficit present.     Mental Status: He is alert and oriented to person, place, and time.  Psychiatric:        Behavior: Behavior normal.      UC Treatments / Results  Labs (all labs ordered are listed, but only abnormal results are displayed) Labs Reviewed  POC SOFIA SARS ANTIGEN FIA - Normal    EKG   Radiology No results found.  Procedures Procedures (including critical care time)  Medications Ordered in UC Medications - No data to display  Initial Impression / Assessment and Plan / UC Course  I have reviewed the triage vital signs and the nursing notes.  Pertinent labs & imaging results that were available during my care of the patient were reviewed by me and considered in my medical decision making (see chart for details).     COVID test is negative.  We discussed a steroid shot, but he declines. Flonase nose spray sent, and I have recommended short term use of Afrin.  Final Clinical Impressions(s) / UC Diagnoses   Final diagnoses:  Viral URI     Discharge Instructions      The COVID test is negative  Fluticasone/Flonase nose spray--put 2 sprays in each nostril once daily  You can use Afrin/Neo-Synephrine/oxymetazoline--twice daily as instructed on the box, but not longer than 3 days in a row.     ED Prescriptions     Medication Sig Dispense Auth. Provider   fluticasone (FLONASE) 50 MCG/ACT nasal spray Place 2 sprays into both nostrils daily. 16 g Vonna Sharlet POUR, MD      PDMP not reviewed this encounter.   Vonna Sharlet POUR, MD 03/28/24 [phone]

## 2024-04-29 ENCOUNTER — Other Ambulatory Visit: Payer: Self-pay | Admitting: Family Medicine

## 2024-04-29 DIAGNOSIS — I2510 Atherosclerotic heart disease of native coronary artery without angina pectoris: Secondary | ICD-10-CM

## 2024-04-29 NOTE — Progress Notes (Signed)
 Father w/ h/o CABG in mid 37s. Similar lifestyle and pt profile.  CAC ordered for screening.

## 2024-05-23 ENCOUNTER — Ambulatory Visit (HOSPITAL_BASED_OUTPATIENT_CLINIC_OR_DEPARTMENT_OTHER)
Admission: RE | Admit: 2024-05-23 | Discharge: 2024-05-23 | Disposition: A | Discharge status: Home / Self Care | Payer: Self-pay | Source: Ambulatory Visit | Attending: Family Medicine | Admitting: Family Medicine

## 2024-05-23 DIAGNOSIS — I2510 Atherosclerotic heart disease of native coronary artery without angina pectoris: Secondary | ICD-10-CM | POA: Insufficient documentation
